# Patient Record
Sex: Female | Born: 1960 | Race: Black or African American | Hispanic: No | Marital: Single | Smoking: Never smoker
Health system: Southern US, Community
[De-identification: ages and names within clinical notes are randomized; demographics above are authoritative.]

## PROBLEM LIST (undated history)

## (undated) DIAGNOSIS — E785 Hyperlipidemia, unspecified: Secondary | ICD-10-CM

## (undated) DIAGNOSIS — E559 Vitamin D deficiency, unspecified: Secondary | ICD-10-CM

## (undated) DIAGNOSIS — Z87442 Personal history of urinary calculi: Secondary | ICD-10-CM

## (undated) DIAGNOSIS — H547 Unspecified visual loss: Secondary | ICD-10-CM

## (undated) DIAGNOSIS — I10 Essential (primary) hypertension: Secondary | ICD-10-CM

## (undated) DIAGNOSIS — L659 Nonscarring hair loss, unspecified: Secondary | ICD-10-CM

## (undated) DIAGNOSIS — F419 Anxiety disorder, unspecified: Secondary | ICD-10-CM

## (undated) DIAGNOSIS — E119 Type 2 diabetes mellitus without complications: Secondary | ICD-10-CM

## (undated) HISTORY — DX: Anxiety disorder, unspecified: F41.9

## (undated) HISTORY — DX: Nonscarring hair loss, unspecified: L65.9

## (undated) HISTORY — DX: Type 2 diabetes mellitus without complications: E11.9

## (undated) HISTORY — DX: Hyperlipidemia, unspecified: E78.5

## (undated) HISTORY — DX: Vitamin D deficiency, unspecified: E55.9

## (undated) HISTORY — DX: Personal history of urinary calculi: Z87.442

## (undated) HISTORY — DX: Essential (primary) hypertension: I10

## (undated) HISTORY — DX: Unspecified visual loss: H54.7

---

## 1984-05-12 HISTORY — PX: ABDOMINAL HYSTERECTOMY: SHX81

## 2001-03-13 ENCOUNTER — Emergency Department (HOSPITAL_COMMUNITY): Admission: EM | Admit: 2001-03-13 | Discharge: 2001-03-13 | Payer: Self-pay | Admitting: Emergency Medicine

## 2004-03-04 ENCOUNTER — Ambulatory Visit (HOSPITAL_COMMUNITY): Admission: RE | Admit: 2004-03-04 | Discharge: 2004-03-04 | Payer: Self-pay | Admitting: Internal Medicine

## 2005-12-19 IMAGING — CR DG CHEST 2V
2 series · 2 of 2 positions shown · non-contrast
Comparison: none

CLINICAL DATA: Cough, congestion, right-sided back pain.  
 CHEST (TWO VIEWS)
 The heart size and mediastinal contours are normal. The lungs are clear. The visualized skeleton is unremarkable.

 IMPRESSION
 No active disease.

[view not recorded (1 of 2)]
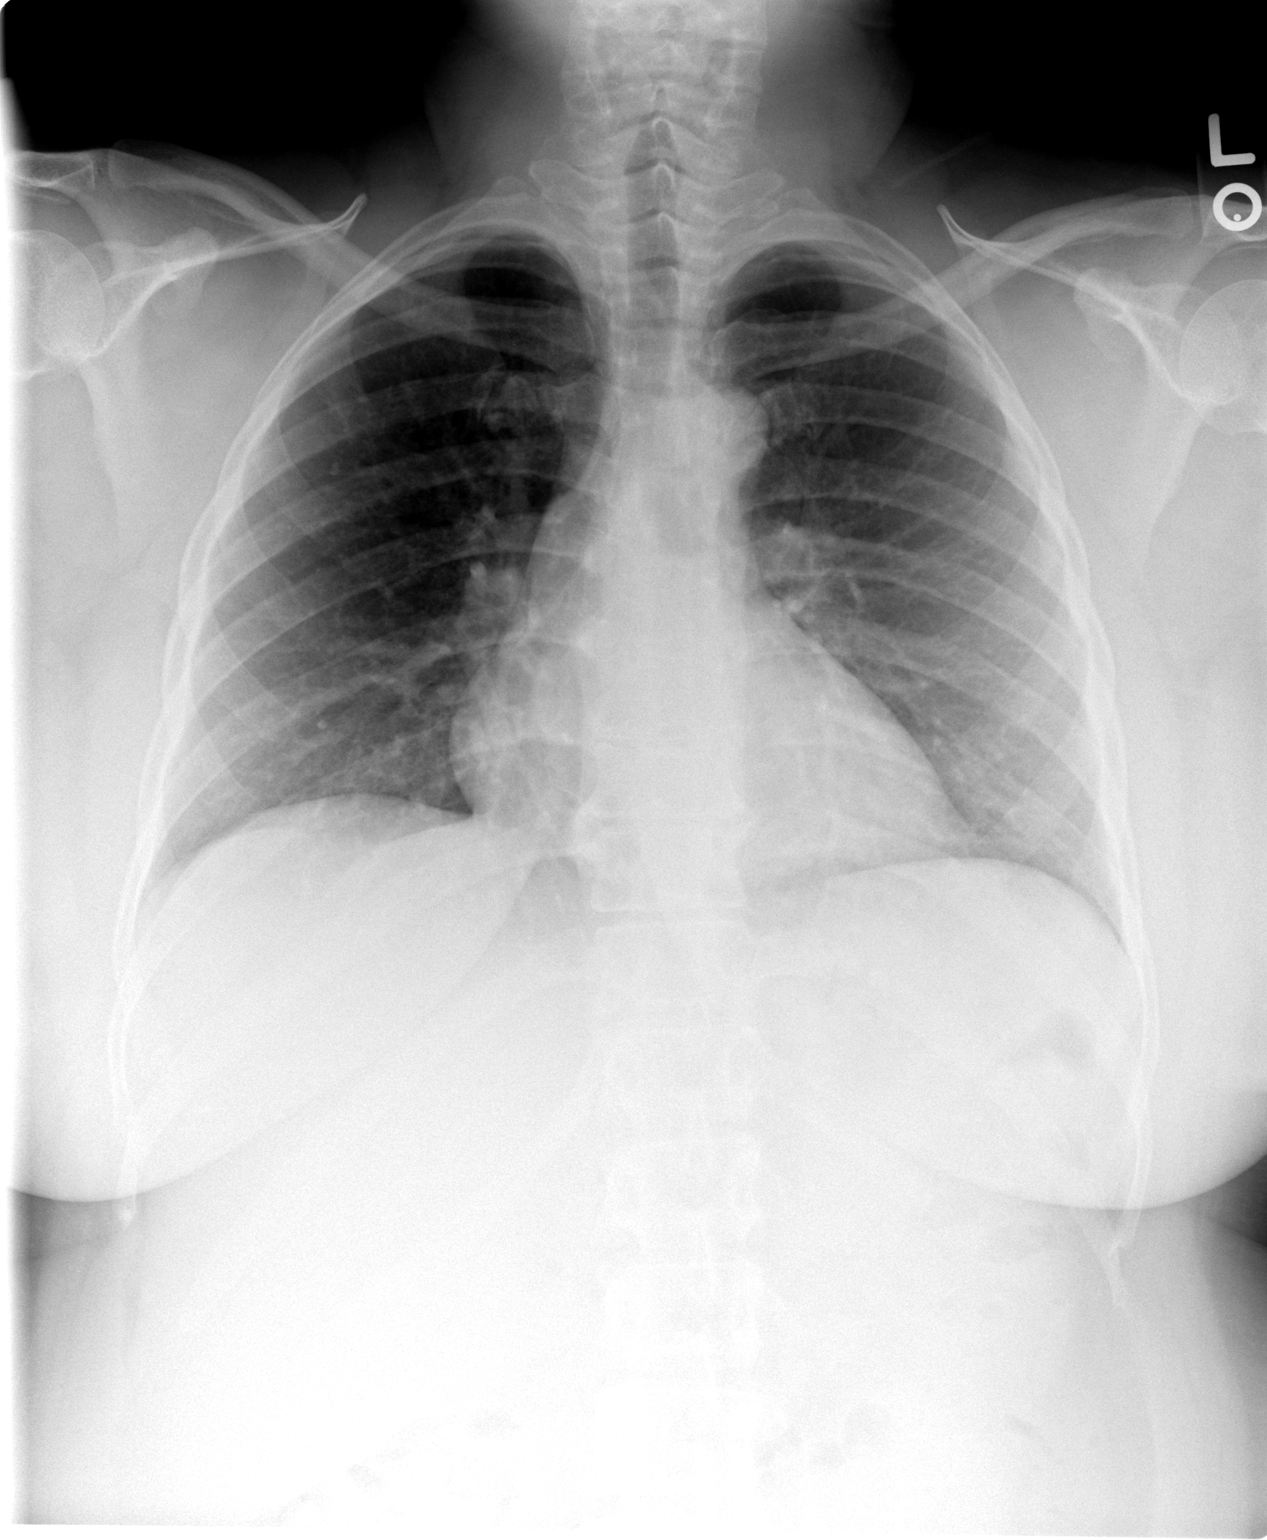

[view not recorded (2 of 2)]
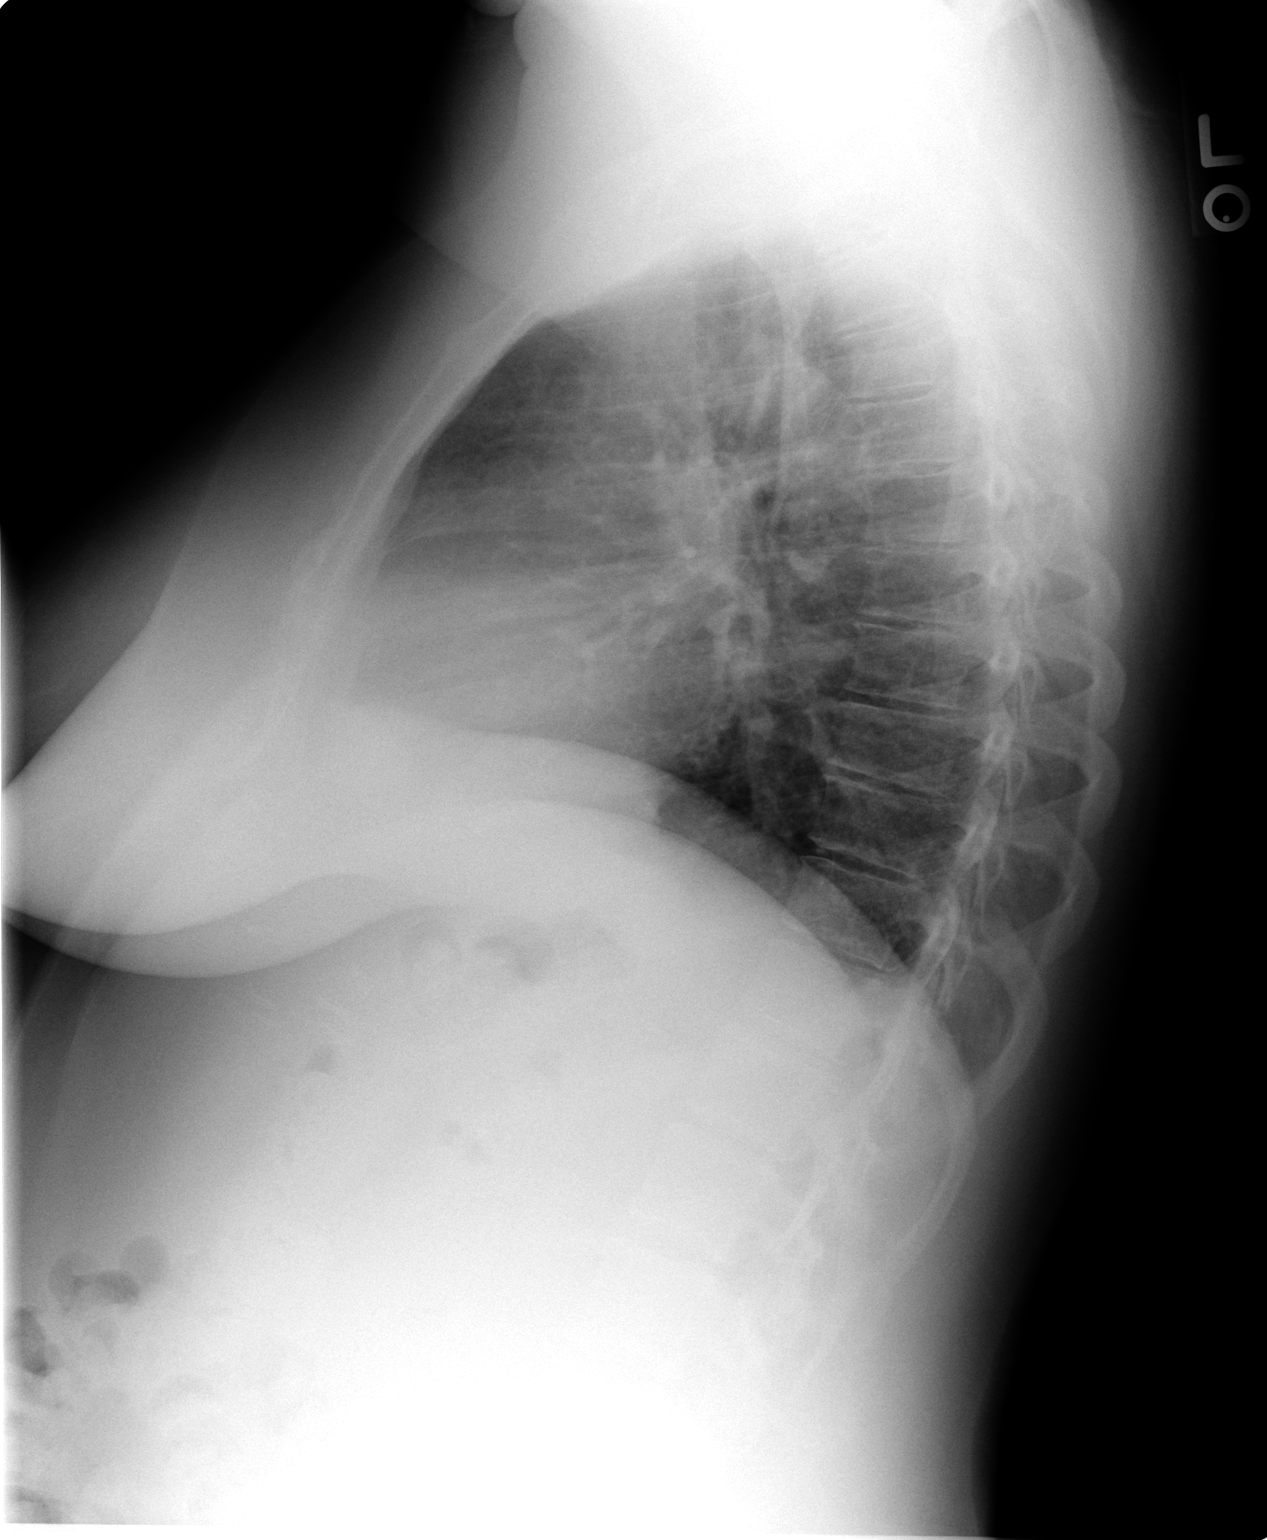

[2 of 2 positions shown; findings below may reference images not displayed]

## 2008-05-25 ENCOUNTER — Encounter: Admission: RE | Admit: 2008-05-25 | Discharge: 2008-05-25 | Payer: Self-pay | Admitting: Obstetrics and Gynecology

## 2010-06-20 ENCOUNTER — Other Ambulatory Visit: Payer: Self-pay | Admitting: Family Medicine

## 2010-06-20 ENCOUNTER — Ambulatory Visit
Admission: RE | Admit: 2010-06-20 | Discharge: 2010-06-20 | Disposition: A | Discharge status: Home / Self Care | Payer: PRIVATE HEALTH INSURANCE | Source: Ambulatory Visit | Attending: Family Medicine | Admitting: Family Medicine

## 2010-06-20 DIAGNOSIS — M839 Adult osteomalacia, unspecified: Secondary | ICD-10-CM

## 2010-06-20 LAB — HM DEXA SCAN: HM Dexa Scan: NORMAL

## 2010-06-24 ENCOUNTER — Other Ambulatory Visit: Payer: Self-pay

## 2010-09-12 ENCOUNTER — Other Ambulatory Visit: Payer: Self-pay | Admitting: Family Medicine

## 2010-09-12 DIAGNOSIS — M545 Low back pain: Secondary | ICD-10-CM

## 2010-09-14 ENCOUNTER — Other Ambulatory Visit: Payer: PRIVATE HEALTH INSURANCE

## 2010-09-24 ENCOUNTER — Other Ambulatory Visit: Payer: PRIVATE HEALTH INSURANCE

## 2012-05-27 ENCOUNTER — Other Ambulatory Visit: Payer: Self-pay | Admitting: Specialist

## 2012-05-27 DIAGNOSIS — M7130 Other bursal cyst, unspecified site: Secondary | ICD-10-CM

## 2012-05-31 ENCOUNTER — Ambulatory Visit
Admission: RE | Admit: 2012-05-31 | Discharge: 2012-05-31 | Disposition: A | Discharge status: Home / Self Care | Payer: Self-pay | Source: Ambulatory Visit | Attending: Specialist | Admitting: Specialist

## 2012-05-31 DIAGNOSIS — M7130 Other bursal cyst, unspecified site: Secondary | ICD-10-CM

## 2012-08-09 ENCOUNTER — Other Ambulatory Visit: Payer: Self-pay | Admitting: Family Medicine

## 2012-08-09 ENCOUNTER — Telehealth: Payer: Self-pay | Admitting: Family Medicine

## 2012-08-09 NOTE — Telephone Encounter (Signed)
Ok to refill 

## 2012-08-09 NOTE — Telephone Encounter (Signed)
Med c/o to pharmacy

## 2012-08-09 NOTE — Telephone Encounter (Signed)
Med c/o to pharmancy

## 2012-08-23 ENCOUNTER — Other Ambulatory Visit: Payer: Self-pay | Admitting: Family Medicine

## 2012-10-01 ENCOUNTER — Telehealth: Payer: Self-pay | Admitting: Family Medicine

## 2012-10-01 MED ORDER — ALPRAZOLAM 1 MG PO TABS: 1.0000 mg | ORAL_TABLET | Freq: Every evening | ORAL | Status: DC | PRN

## 2012-10-01 NOTE — Telephone Encounter (Signed)
Ok to refill 

## 2012-10-01 NOTE — Telephone Encounter (Signed)
?   OK to Refill  

## 2012-11-18 ENCOUNTER — Other Ambulatory Visit: Payer: Self-pay | Admitting: Family Medicine

## 2012-12-16 ENCOUNTER — Other Ambulatory Visit: Payer: Self-pay | Admitting: Family Medicine

## 2013-01-11 ENCOUNTER — Other Ambulatory Visit: Payer: Self-pay | Admitting: Family Medicine

## 2013-01-11 NOTE — Telephone Encounter (Signed)
?   OK to Refill  

## 2013-01-11 NOTE — Telephone Encounter (Signed)
ok 

## 2013-01-21 ENCOUNTER — Encounter: Payer: Self-pay | Admitting: Family Medicine

## 2013-01-21 ENCOUNTER — Other Ambulatory Visit: Payer: Self-pay | Admitting: Family Medicine

## 2013-01-21 DIAGNOSIS — I10 Essential (primary) hypertension: Secondary | ICD-10-CM | POA: Insufficient documentation

## 2013-01-21 DIAGNOSIS — F419 Anxiety disorder, unspecified: Secondary | ICD-10-CM | POA: Insufficient documentation

## 2013-01-21 DIAGNOSIS — E785 Hyperlipidemia, unspecified: Secondary | ICD-10-CM | POA: Insufficient documentation

## 2013-01-21 DIAGNOSIS — H547 Unspecified visual loss: Secondary | ICD-10-CM | POA: Insufficient documentation

## 2013-01-21 DIAGNOSIS — E119 Type 2 diabetes mellitus without complications: Secondary | ICD-10-CM | POA: Insufficient documentation

## 2013-01-21 DIAGNOSIS — E559 Vitamin D deficiency, unspecified: Secondary | ICD-10-CM | POA: Insufficient documentation

## 2013-01-21 DIAGNOSIS — Z87442 Personal history of urinary calculi: Secondary | ICD-10-CM | POA: Insufficient documentation

## 2013-01-21 NOTE — Telephone Encounter (Signed)
Medication refill for one time only.  Patient needs to be seen.  Letter sent for patient to call and schedule Patient last seen 01/2012  Was told to return in 3 months.

## 2013-01-31 ENCOUNTER — Encounter: Payer: Self-pay | Admitting: Physical Medicine & Rehabilitation

## 2013-02-16 ENCOUNTER — Other Ambulatory Visit: Payer: Self-pay | Admitting: Family Medicine

## 2013-02-17 NOTE — Telephone Encounter (Signed)
?   OK to Refill  

## 2013-02-17 NOTE — Telephone Encounter (Signed)
ok 

## 2013-02-17 NOTE — Telephone Encounter (Signed)
Meds refilled.

## 2013-02-18 ENCOUNTER — Encounter: Payer: Self-pay | Admitting: Physical Medicine & Rehabilitation

## 2013-02-18 ENCOUNTER — Encounter: Payer: Worker's Compensation | Attending: Physical Medicine & Rehabilitation

## 2013-02-18 ENCOUNTER — Ambulatory Visit (HOSPITAL_BASED_OUTPATIENT_CLINIC_OR_DEPARTMENT_OTHER): Payer: Worker's Compensation | Admitting: Physical Medicine & Rehabilitation

## 2013-02-18 VITALS — BP 178/101 | HR 84 | Resp 14 | Ht 61.0 in | Wt 171.4 lb

## 2013-02-18 DIAGNOSIS — M5030 Other cervical disc degeneration, unspecified cervical region: Secondary | ICD-10-CM

## 2013-02-18 DIAGNOSIS — M545 Low back pain, unspecified: Secondary | ICD-10-CM

## 2013-02-18 DIAGNOSIS — M47812 Spondylosis without myelopathy or radiculopathy, cervical region: Secondary | ICD-10-CM | POA: Insufficient documentation

## 2013-02-18 DIAGNOSIS — M5380 Other specified dorsopathies, site unspecified: Secondary | ICD-10-CM

## 2013-02-18 NOTE — Progress Notes (Signed)
Subjective:    Patient ID: Rebecca Mora, female    DOB: [birth year], 52 y.o.   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 Pain was not immediate but started several hours later. Some pain into right arm.  Also has pain that radiates from neck into posterior scalp  Low back pain as well radiating to RLE Initially seen by regional physicians, x-rays taken, prescribed Vicodin, sent to physical therapy. Saw PT for 24 visits.  Heat, biofreeze and TENS unit.  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 Supra and infraspinatus tendinosis. Moderate a.c. joint arthritis MRI lumbar spine 03/18/2012 normal MRI of the cervical spine 06/07/2012. Right C6-C7 disc protrusion without compression of the right C7 nerve root.  Underwent attempted cyst drainage by interventional radiology- pt states her arm "has never been the same since" Underwent right C4-5 and right C6-C7 transforaminal epidural steroid injection under fluoroscopic guidance 07/20/2012. Far lateral foramen injection secondary to vascular structures. Was reported as helpful 08/03/2012 right third occipital nerve blocks under fluoroscopic guidance. 08/24/2012 bilateral facet joints injections L4-5, L5-S1 10/27/2012 bilateral L3-L4 lumbar medial branch and L5 dorsal ramus injections without relief Followup after injections. Had improved tingling right hand. Residual right shoulder pain. Had a right shoulder injection with Dr.Collins, orthopedics, feels like her headache and neck pain started back up  Patient trials on tramadol which helps somewhat, currently on baclofen and neurontin with some benefit. PMH: HTN (pt states it is due to pain from injury, but has been treated with antihypertensives in past), DM Pain Inventory Average Pain 10 Pain Right Now  10 My pain is constant, sharp and aching  In the last 24 hours, has pain interfered with the following? General activity 10 Relation with others 9 Enjoyment of life 10 What TIME of day is your pain at its worst? morning and night Sleep (in general) Poor  Pain is worse with: walking, sitting, standing and some activites Pain improves with: rest, heat/ice, medication and TENS Relief from Meds: 8  Mobility walk without assistance ability to climb steps?  yes do you drive?  yes  Function disabled: date disabled 12/09/11 I need assistance with the following:  dressing and household duties  Neuro/Psych weakness numbness tingling trouble walking spasms depression anxiety suicidal thoughts  Just thoughts but no plan  Prior Studies Any changes since last visit?  no  Physicians involved in your care Any changes since last visit?  no   History reviewed. No pertinent family history. History   Social History  . Marital Status: Single    Spouse Name: N/A    Number of Children: N/A  . Years of Education: N/A   Social History Main Topics  . Smoking status: Never Smoker   . Smokeless tobacco: Never Used  . Alcohol Use: No  . Drug Use: No  . Sexual Activity: None   Other Topics Concern  . None   Social History Narrative  . None   Past Surgical History  Procedure Laterality Date  . Abdominal hysterectomy  1986   Past Medical History  Diagnosis Date  . Diabetes mellitus without complication   . Hypertension   . Vision decreased   . Hyperlipidemia   . Hair loss   . Anxiety   . History of renal calculi   . Unspecified vitamin D deficiency  BP 178/101  Pulse 84  Resp 14  Ht 5\' 1"  (1.549 m)  Wt 171 lb 6.4 oz (77.747 kg)  BMI 32.4 kg/m2  SpO2 99%   Review of Systems  Constitutional: Positive for diaphoresis.  Gastrointestinal: Positive for nausea.  Endocrine:       High blood sugar  Musculoskeletal: Positive for back pain and gait problem.        Spasms  Neurological: Positive for weakness and numbness.       Tingling  Psychiatric/Behavioral: Positive for suicidal ideas and dysphoric mood. The patient is nervous/anxious.        No plan  All other systems reviewed and are negative.       Objective:   Physical Exam  Nursing note and vitals reviewed. Constitutional: She is oriented to person, place, and time. She appears well-developed and well-nourished.  HENT:  Head: Normocephalic and atraumatic.  Eyes: Conjunctivae and EOM are normal. Pupils are equal, round, and reactive to light.  Neck: Normal range of motion.  Neurological: She is alert and oriented to person, place, and time. She has normal strength and normal reflexes. Coordination and gait normal.  Reduced sensory  right lower extremity non-dermatomal patterns Reduced in the right upper in a nondermatomal pattern  Psychiatric: Her mood appears anxious. Her affect is labile.   Right Shoulder was negative impingement signs. Pain over the right a.c. joint and pain with adduction right shoulder Left shoulder normal exam Pain with minimal palpation of the trapezius muscles thoracic and lumbar paraspinal muscles.  No pain with hip and knee or ankle range of motion. No pain with elbow or hand range of motion        Assessment & Plan:   Work related injury with multiple pain complaints. 1. Low back pain with 1set of the facet joints injections at the L4-L5 and L5-S1 levels that were helpful but were intra-articular Second set medial branch blocks L3, L4 as well as L5 dorsal ramus. This also blocks pain from the L4-L5 and L5-S1 levels. This is more predictable of response to radio frequency neuropathy which targets the same nerves.  Therefore I do not think radio frequency ablation in the lumbar area would be helpful for this patient. In addition she is positive Waddell's signs , specifically pain with very superficial palpation in the thoracic and lumbar paraspinal  area.  For this reason as well as reported anxiety and depression, I would recommend pain psychology evaluation.  2. In regards to the third occipital nerve block for neck and headache pain. This is not for occipital neuralgia but rather to address C2-C3 cervical facet joint. This is essentially a nomenclature issue.  Right C2-C3 facet joint mediated pain can cause both upper neck pain as well as posterior headache pain. It is in part supplied by the third occipital nerve. Patient appears to have had good relief with third occipital nerve block. I believe it wore off at about the same time that she received a shoulder injection from orthopedics. I do not think the shoulder injections caused the headaches to return. I would recommend a repeat third occipital nerve block and if she once again has a good but temporary relief, then I would recommend third occipital nerve radio frequency ablation  3. Right shoulder pain I do not think the supraspinatus cyst is causing this pain. Her exam is most consistent with a.c. joint arthropathy and she may benefit from an ultrasound guided injection. This can be either a diagnostic injection with  2% lidocaine only or diagnostic plus therapeutic using both lidocaine and a corticosteroid  4. Pain medication management. I would not institute narcotic analgesics in this situation. Her current medications seem to be helping to some degree. No other recommendations.  This is a consultation only. No treatment was rendered.  Discussed my findings with Case manager Darcus Austin fax [phone] and patient                    Erick Colace M.D. [facility] FAAPM&R (Sports Med, Neuromuscular Med) Diplomate Am Board of Electrodiagnostic Med Diplomate Am Board of Pain Medicine Fellow Am Board of Interventional Pain Physicians

## 2013-02-18 NOTE — Patient Instructions (Signed)
This was a consultation only  I do not recommend radio frequency to the lumbar area, based on one set of medial branch blocks being helpful and the other not helpful.  I recommend another right third occipital nerve block and if this has temporary benefits then proceed to radio frequency ablation  I believe the right shoulder pain is in large part due to a.c. joint arthritis. Ultrasound guided a.c. joint injection may be helpful in this regard.  I do not have any additional pain medication recommendations. Medications she is on now appear appropriate.

## 2013-02-25 ENCOUNTER — Encounter: Payer: Self-pay | Admitting: Family Medicine

## 2013-02-25 ENCOUNTER — Other Ambulatory Visit: Payer: Self-pay | Admitting: Family Medicine

## 2013-02-25 NOTE — Telephone Encounter (Signed)
Medication refill for one time only.  Patient needs to be seen.  Letter sent for patient to call and schedule 

## 2013-03-28 ENCOUNTER — Other Ambulatory Visit: Payer: Self-pay | Admitting: Family Medicine

## 2013-04-11 ENCOUNTER — Encounter: Payer: Self-pay | Admitting: Family Medicine

## 2013-04-11 ENCOUNTER — Ambulatory Visit (INDEPENDENT_AMBULATORY_CARE_PROVIDER_SITE_OTHER): Payer: BC Managed Care – PPO | Admitting: Family Medicine

## 2013-04-11 VITALS — BP 130/80 | HR 82 | Temp 99.0°F | Resp 16 | Wt 174.0 lb

## 2013-04-11 DIAGNOSIS — F329 Major depressive disorder, single episode, unspecified: Secondary | ICD-10-CM

## 2013-04-11 LAB — LIPID PANEL
Cholesterol: 265 mg/dL — ABNORMAL HIGH (ref 0–200)
HDL: 55 mg/dL (ref >39)
LDL Cholesterol: 192 mg/dL — ABNORMAL HIGH (ref 0–99)
Total CHOL/HDL Ratio: 4.8 Ratio
Triglycerides: 88 mg/dL (ref <150)
VLDL: 18 mg/dL (ref 0–40)

## 2013-04-11 LAB — HEMOGLOBIN A1C
Hgb A1c MFr Bld: 8.6 % — ABNORMAL HIGH (ref <5.7)
Mean Plasma Glucose: 200 mg/dL — ABNORMAL HIGH (ref <117)

## 2013-04-11 LAB — COMPLETE METABOLIC PANEL WITH GFR
ALT: 28 U/L (ref 0–35)
AST: 25 U/L (ref 0–37)
Albumin: 4.4 g/dL (ref 3.5–5.2)
Alkaline Phosphatase: 113 U/L (ref 39–117)
BUN: 15 mg/dL (ref 6–23)
CO2: 28 mEq/L (ref 19–32)
Calcium: 9.5 mg/dL (ref 8.4–10.5)
Chloride: 103 mEq/L (ref 96–112)
Creat: 0.57 mg/dL (ref 0.50–1.10)
GFR, Est African American: >89 mL/min
GFR, Est Non African American: >89 mL/min
Glucose, Bld: 191 mg/dL — ABNORMAL HIGH (ref 70–99)
Potassium: 4.2 mEq/L (ref 3.5–5.3)
Sodium: 140 mEq/L (ref 135–145)
Total Bilirubin: 0.3 mg/dL (ref 0.3–1.2)
Total Protein: 7.1 g/dL (ref 6.0–8.3)

## 2013-04-11 MED ORDER — ALPRAZOLAM 1 MG PO TABS: ORAL_TABLET | ORAL | Status: DC

## 2013-04-11 MED ORDER — DULOXETINE HCL 30 MG PO CPEP: 60.0000 mg | ORAL_CAPSULE | Freq: Every day | ORAL | Status: DC

## 2013-04-11 NOTE — Progress Notes (Signed)
Subjective:    Patient ID: Rebecca Mora, female    DOB: 03-28-1961, 52 y.o.   MRN: [id number]  HPI I have not seen the patient in a year. Since her last office visit she was injured on the job. She suffered a herniated disc in her cervical spine which caused her tremendous neck pain and cervical radiculopathy into the right upper extremity. This is caused her to miss work for now over a year. She is currently in the legal process with compensation. This caused significant depression. She is unable to sleep due to the pain. She has constant neuropathic pain in her right arm. She is failed numerous epidural steroid injections. She is to gabapentin. She denies any suicidal ideation. She is having the Xanax more frequently due to the depression.  She is also here today to recheck her diabetes. She is not checking her sugars at all. Her blood pressures well controlled today on her medications. She denies any polyuria polydipsia or blurred vision. She denies any hypoglycemia. Past Medical History  Diagnosis Date  . Diabetes mellitus without complication   . Hypertension   . Vision decreased   . Hyperlipidemia   . Hair loss   . Anxiety   . History of renal calculi   . Unspecified vitamin D deficiency    Current Outpatient Prescriptions on File Prior to Visit  Medication Sig Dispense Refill  . baclofen (LIORESAL) 10 MG tablet Take 1 tablet by mouth 3 (three) times daily.      . dorzolamide (TRUSOPT) 2 % ophthalmic solution 1 drop 3 (three) times daily.      Marland Kitchen glipiZIDE (GLUCOTROL) 10 MG tablet TAKE 1 TABLET BY MOUTH TWICE DAILY  60 tablet  0  . imipramine (TOFRANIL) 50 MG tablet Take 50 mg by mouth at bedtime.      Marland Kitchen latanoprost (XALATAN) 0.005 % ophthalmic solution 1 drop at bedtime.      . metFORMIN (GLUCOPHAGE) 1000 MG tablet TAKE 1 TABLET BY MOUTH TWICE DAILY  60 tablet  0   No current facility-administered medications on file prior to visit.   Allergies  Allergen Reactions  . Lisinopril  Cough  . Simvastatin     Breast pain   History   Social History  . Marital Status: Single    Spouse Name: N/A    Number of Children: N/A  . Years of Education: N/A   Occupational History  . Not on file.   Social History Main Topics  . Smoking status: Never Smoker   . Smokeless tobacco: Never Used  . Alcohol Use: No  . Drug Use: No  . Sexual Activity: Not on file   Other Topics Concern  . Not on file   Social History Narrative  . No narrative on file      Review of Systems  All other systems reviewed and are negative.       Objective:   Physical Exam  Vitals reviewed. Cardiovascular: Normal rate and regular rhythm.   Pulmonary/Chest: Effort normal and breath sounds normal.  Psychiatric: She has a normal mood and affect. Her behavior is normal. Judgment and thought content normal.          Assessment & Plan:  1. Depression Majority of the office visit was spent in discussion regarding her depression. Begin Cymbalta 60 mg by mouth daily and recheck in one month. I did give the patient one time prescription for Xanax 0.5 mg tablets 1 by mouth every 8 hours as needed  for anxiety. I gave her 60 tablets 0 refills. I hope this Cymbalta take effect we can wean her back on the Xanax - DULoxetine (CYMBALTA) 30 MG capsule; Take 2 capsules (60 mg total) by mouth daily.  Dispense: 60 capsule; Refill: 3  2. Type II or unspecified type diabetes mellitus without mention of complication, uncontrolled Check CMP, fasting lipid panel, hemoglobin A1c. Consider invokana if greater than 7. - COMPLETE METABOLIC PANEL WITH GFR - Lipid panel - Hemoglobin A1c

## 2013-04-18 ENCOUNTER — Other Ambulatory Visit: Payer: Self-pay | Admitting: Family Medicine

## 2013-04-18 MED ORDER — ATORVASTATIN CALCIUM 40 MG PO TABS: 40.0000 mg | ORAL_TABLET | Freq: Every day | ORAL | Status: AC

## 2013-04-18 MED ORDER — SITAGLIPTIN PHOSPHATE 100 MG PO TABS: 100.0000 mg | ORAL_TABLET | Freq: Every day | ORAL | Status: DC

## 2013-04-18 NOTE — Telephone Encounter (Signed)
Med sent to pharm 

## 2013-04-23 ENCOUNTER — Other Ambulatory Visit: Payer: Self-pay | Admitting: Family Medicine

## 2013-05-13 ENCOUNTER — Ambulatory Visit: Payer: Self-pay | Admitting: Family Medicine

## 2013-05-30 ENCOUNTER — Other Ambulatory Visit: Payer: Self-pay | Admitting: Family Medicine

## 2013-05-30 NOTE — Telephone Encounter (Signed)
Medication refilled per protocol. 

## 2013-06-24 ENCOUNTER — Telehealth: Payer: Self-pay | Admitting: Family Medicine

## 2013-06-24 NOTE — Telephone Encounter (Signed)
Call back number [phone] Pharmacy is [facility] [place] Pt is needing a refill strips for her machine

## 2013-06-24 NOTE — Telephone Encounter (Signed)
TTC pt back - line busy - Need to know what kind of meter she has and how many times she checks her BS???

## 2013-06-30 MED ORDER — GLUCOSE BLOOD VI STRP: ORAL_STRIP | Status: AC

## 2013-06-30 NOTE — Telephone Encounter (Signed)
Refilled strips for accu-chek contour

## 2013-07-26 ENCOUNTER — Other Ambulatory Visit: Payer: Self-pay | Admitting: Family Medicine

## 2013-07-27 NOTE — Telephone Encounter (Signed)
?   OK to Refill  

## 2013-07-28 NOTE — Telephone Encounter (Signed)
ok 

## 2013-07-28 NOTE — Telephone Encounter (Signed)
Medication called to pharmacy. 

## 2013-08-11 ENCOUNTER — Ambulatory Visit (INDEPENDENT_AMBULATORY_CARE_PROVIDER_SITE_OTHER): Payer: BC Managed Care – PPO | Admitting: Family Medicine

## 2013-08-11 ENCOUNTER — Ambulatory Visit: Payer: BC Managed Care – PPO | Admitting: Family Medicine

## 2013-08-11 ENCOUNTER — Telehealth: Payer: Self-pay | Admitting: *Deleted

## 2013-08-11 ENCOUNTER — Encounter: Payer: Self-pay | Admitting: Family Medicine

## 2013-08-11 VITALS — BP 130/90 | HR 74 | Temp 98.5°F | Resp 18 | Ht 61.0 in | Wt 172.0 lb

## 2013-08-11 DIAGNOSIS — M5416 Radiculopathy, lumbar region: Secondary | ICD-10-CM

## 2013-08-11 DIAGNOSIS — IMO0002 Reserved for concepts with insufficient information to code with codable children: Secondary | ICD-10-CM

## 2013-08-11 DIAGNOSIS — M5412 Radiculopathy, cervical region: Secondary | ICD-10-CM

## 2013-08-11 DIAGNOSIS — E118 Type 2 diabetes mellitus with unspecified complications: Principal | ICD-10-CM

## 2013-08-11 DIAGNOSIS — E1165 Type 2 diabetes mellitus with hyperglycemia: Secondary | ICD-10-CM

## 2013-08-11 DIAGNOSIS — M5010 Cervical disc disorder with radiculopathy, unspecified cervical region: Secondary | ICD-10-CM

## 2013-08-11 LAB — COMPLETE METABOLIC PANEL WITH GFR
ALBUMIN: 4.2 g/dL (ref 3.5–5.2)
ALT: 28 U/L (ref 0–35)
AST: 25 U/L (ref 0–37)
Alkaline Phosphatase: 98 U/L (ref 39–117)
BUN: 15 mg/dL (ref 6–23)
CALCIUM: 9.2 mg/dL (ref 8.4–10.5)
CHLORIDE: 106 meq/L (ref 96–112)
CO2: 26 meq/L (ref 19–32)
Creat: 0.61 mg/dL (ref 0.50–1.10)
GFR, Est African American: >89 mL/min
GFR, Est Non African American: >89 mL/min
Glucose, Bld: 78 mg/dL (ref 70–99)
Potassium: 4.1 mEq/L (ref 3.5–5.3)
Sodium: 141 mEq/L (ref 135–145)
Total Bilirubin: 0.2 mg/dL (ref 0.2–1.2)
Total Protein: 6.9 g/dL (ref 6.0–8.3)

## 2013-08-11 LAB — HEMOGLOBIN A1C
HEMOGLOBIN A1C: 7.2 % — AB (ref <5.7)
MEAN PLASMA GLUCOSE: 160 mg/dL — AB (ref <117)

## 2013-08-11 MED ORDER — HYDROCODONE-ACETAMINOPHEN 5-325 MG PO TABS: 1.0000 | ORAL_TABLET | Freq: Four times a day (QID) | ORAL | Status: DC | PRN

## 2013-08-11 NOTE — Addendum Note (Signed)
Addended by: Legrand RamsWILLIS, Jessamyn Watterson B on: 08/11/2013 04:57 PM   Modules accepted: Orders

## 2013-08-11 NOTE — Telephone Encounter (Signed)
RX printed, left up front and patient aware to pick up  

## 2013-08-11 NOTE — Telephone Encounter (Signed)
Brandi from Dr. Lacinda AxonBetheas office called stating that it was ok if Dr. Tanya NonesPickard wrote out her Pain medication hydrocodone.

## 2013-08-11 NOTE — Progress Notes (Signed)
Subjective:    Patient ID: Rebecca Mora, female    DOB: 1960/05/24, 53 y.o.   MRN: [id number]  HPI  Patient is very upset today. She states she's and uncontrolled pain. There is a very complicated past medical history. I have no old records to review. Apparently she hurt her neck on the job. She's been seeing workers, doctors over the last year. She is diagnosed with cervical degenerative disc disease and bulging disc causing pain in her neck radiating into her right arm. She underwent 2 epidural steroid injections in her neck. She continues to have severe unrelenting pain in her right arm along with numbness and tingling in right arm. She is also seeing orthopedist associated with workers compensation he thought the pain in her arm may be due to her rotator cuff. There has been apparently debate and argument over which is the source of her problem. However the problem continues to worsen. She has no conduction studies/EMG studies pending to her pain management specialist. I have no records to review this at this time. She also complains of pain in her lower back with numbness and tingling radiating into both legs. Apparently she had an MRI of the lumbar spine which showed degenerative disc disease at L4-L5. She states the doctor recommended possibly a fusion at this level. She complains of low back pain and pain in both hips and thighs.  She is requesting 30 hydrocodone today for pain. She is requesting a referral to help her deal with the pain. She states that she is unable to work based on the restrictions that were compensation doctor gave her. Overall she is very distraught. Apparently she last compensation specialist March 25. Past Medical History  Diagnosis Date  . Diabetes mellitus without complication   . Hypertension   . Vision decreased   . Hyperlipidemia   . Hair loss   . Anxiety   . History of renal calculi   . Unspecified vitamin D deficiency    Current Outpatient Prescriptions on  File Prior to Visit  Medication Sig Dispense Refill  . ALPRAZolam (XANAX) 1 MG tablet TAKE 1 TABLET BY MOUTH AT BEDTIME AS NEEDED  60 tablet  0  . atorvastatin (LIPITOR) 40 MG tablet Take 1 tablet (40 mg total) by mouth daily.  30 tablet  3  . dorzolamide (TRUSOPT) 2 % ophthalmic solution 1 drop 3 (three) times daily.      Marland Kitchen glipiZIDE (GLUCOTROL) 10 MG tablet TAKE 1 TABLET BY MOUTH TWICE DAILY  60 tablet  5  . glucose blood test strip Check BS bid  100 each  11  . metFORMIN (GLUCOPHAGE) 1000 MG tablet TAKE 1 TABLET BY MOUTH TWICE DAILY  60 tablet  3  . sitaGLIPtin (JANUVIA) 100 MG tablet Take 1 tablet (100 mg total) by mouth daily.  30 tablet  3  . HYDROcodone-acetaminophen (NORCO/VICODIN) 5-325 MG per tablet Take 1 tablet by mouth every 6 (six) hours as needed for moderate pain.       No current facility-administered medications on file prior to visit.   Allergies  Allergen Reactions  . Lisinopril Cough  . Simvastatin     Breast pain   History   Social History  . Marital Status: Single    Spouse Name: N/A    Number of Children: N/A  . Years of Education: N/A   Occupational History  . Not on file.   Social History Main Topics  . Smoking status: Never Smoker   . Smokeless  tobacco: Never Used  . Alcohol Use: No  . Drug Use: No  . Sexual Activity: Not on file   Other Topics Concern  . Not on file   Social History Narrative  . No narrative on file     Review of Systems  All other systems reviewed and are negative.       Objective:   Physical Exam  Vitals reviewed. Cardiovascular: Normal rate and regular rhythm.   Pulmonary/Chest: Effort normal and breath sounds normal.   the entire office visit was spent in discussion of her history and her symptoms. Over 25 minutes was spent in discussion. It was not time for a physical examination.        Assessment & Plan:  1. Type II or unspecified type diabetes mellitus with unspecified complication,  uncontrolled Patient is overdue to hemoglobin A1c. I will get this today while she is here - Hemoglobin A1c - COMPLETE METABOLIC PANEL WITH GFR  2. Cervical disc disorder with radiculopathy of cervical region  3. Lumbar radiculopathy  Both of these issues are problems which have been occurring for over one year in the arena of workers compensation. She has seen two different orthopedists along with a pain management specialist. There has been apparently some debate over the source of her pain. I have no records to review. I do not have access to the MRI of her cervical spine or lumbar spine. I do not have the results of EMG nerve conduction studies. Based on history alone it sounds like the patient may have cervical radiculopathy and lumbar radiculopathy. Short of surgery I can offer the patient and a trial of lyrica to treat her pain. However I have no records what medications have been tried in the past. The patient states the gabapentin made her too sleepy. She tried a handful of other medications that did not help. She specifically wants hydrocodone today. I explained to the patient that because she sees a pain management doctor, she would need to call that doctor and get their permission before I would refill any hydrocodone. I would like to get the records so that I can review them and see all the test and medications that have been tried over the last year before I could make a guess as to her diagnosis or any methods of treatment. After I had a chance to review all of her records including her studies, I can maybe better help her control her pain.

## 2013-08-21 ENCOUNTER — Other Ambulatory Visit: Payer: Self-pay | Admitting: Family Medicine

## 2013-08-22 ENCOUNTER — Telehealth: Payer: Self-pay | Admitting: Family Medicine

## 2013-08-22 NOTE — Telephone Encounter (Signed)
PA for Januvia sent through [email or web address]

## 2013-08-24 ENCOUNTER — Telehealth: Payer: Self-pay | Admitting: *Deleted

## 2013-08-24 MED ORDER — SITAGLIPTIN PHOSPHATE 100 MG PO TABS: ORAL_TABLET | ORAL | Status: DC

## 2013-08-24 NOTE — Telephone Encounter (Signed)
Refill appropriate and filled per protocol. 

## 2013-08-24 NOTE — Telephone Encounter (Signed)
Message copied by Phillips OdorSIX, Dash Cardarelli H on Wed Aug 24, 2013  3:35 PM ------      Message from: Malvin JohnsBULLINS, SUSAN S      Created: Wed Aug 24, 2013 12:26 PM       Patient is calling for refill on her SitaGLIPtin Phosphate (Tab) needs to [facility] to [facility] phone number [phone] ------

## 2013-08-24 NOTE — Telephone Encounter (Signed)
Received PA determination for Januvia.   PA denied.   Patient must try and fail Onglyza.  Md to be made aware.

## 2013-08-25 NOTE — Telephone Encounter (Signed)
Please switch the patient to onglyza 5 mg poqday.

## 2013-08-26 MED ORDER — SAXAGLIPTIN HCL 5 MG PO TABS: 5.0000 mg | ORAL_TABLET | Freq: Every day | ORAL | Status: DC

## 2013-08-26 NOTE — Telephone Encounter (Signed)
Pt called and requested med to be sent to different pharm. Med sent to requested pharm.

## 2013-08-26 NOTE — Telephone Encounter (Signed)
Prescription sent to pharmacy.

## 2013-08-26 NOTE — Addendum Note (Signed)
Addended by: Legrand RamsWILLIS, SANDY B on: 08/26/2013 11:41 AM   Modules accepted: Orders

## 2013-08-29 ENCOUNTER — Telehealth: Payer: Self-pay | Admitting: Family Medicine

## 2013-08-29 NOTE — Telephone Encounter (Signed)
We can do this one time until she gets nerve conduction studies.  I have no received any records from her doctors.  I will not be able to refill this in the future without their records.

## 2013-08-29 NOTE — Telephone Encounter (Signed)
?   OK to Refill and increase dose

## 2013-08-29 NOTE — Telephone Encounter (Signed)
Patient would like refill on her hydrocodone but she needs 10/325 this time instead please call her at [phone] with questions

## 2013-08-30 MED ORDER — HYDROCODONE-ACETAMINOPHEN 10-325 MG PO TABS: 1.0000 | ORAL_TABLET | Freq: Three times a day (TID) | ORAL | Status: AC | PRN

## 2013-08-30 NOTE — Telephone Encounter (Signed)
Pt called told Rx ready for pick up.  Also told no more refills until Dr Tanya NonesPickard get the records from other providers.  She said she will sigh record release when she come for RX

## 2013-09-20 ENCOUNTER — Telehealth: Payer: Self-pay | Admitting: Family Medicine

## 2013-09-20 NOTE — Telephone Encounter (Signed)
Ok to refill??  Last office visit 08/11/2013.  Last refill 07/28/2013.

## 2013-09-20 NOTE — Telephone Encounter (Signed)
ok 

## 2013-09-20 NOTE — Telephone Encounter (Signed)
PATIENT CHANGED PHARMACIES AND NEED HER XANAX [facility] [place] HER PHONE NUMBER IS [phone]

## 2013-09-21 MED ORDER — ALPRAZOLAM 1 MG PO TABS: ORAL_TABLET | ORAL | Status: DC

## 2013-09-21 NOTE — Telephone Encounter (Signed)
Medication called to pharmacy. 

## 2013-09-27 ENCOUNTER — Telehealth: Payer: Self-pay | Admitting: Family Medicine

## 2013-09-27 NOTE — Telephone Encounter (Signed)
Patient is calling to get rx for her hydrocodone [phone]

## 2013-09-27 NOTE — Telephone Encounter (Signed)
?   OK to Refill  

## 2013-09-29 ENCOUNTER — Other Ambulatory Visit: Payer: Self-pay | Admitting: Family Medicine

## 2013-09-29 MED ORDER — METFORMIN HCL 1000 MG PO TABS: ORAL_TABLET | ORAL | Status: DC

## 2013-09-29 NOTE — Telephone Encounter (Signed)
Call placed to patient and patient made aware.   Patient states that she signed consent for release of records for Pain Management and neurology.   Medical records states that she believes that records have been obtained and sent to scan.   Patient continues to report increased pain.   MD please advise.

## 2013-09-29 NOTE — Telephone Encounter (Signed)
Denied.  I gave her a 1 time rx until she had her nerve conduction study.  .  I have never received any records from her pain management specialist. This needs to be addressed by her pain management dr or I need records.

## 2013-09-29 NOTE — Telephone Encounter (Signed)
Call placed to patient and patient made aware.  

## 2013-09-29 NOTE — Telephone Encounter (Signed)
1 doctor needs to be managing her pain for her safety.  Therefore, as long as she is seeing ortho and pain management, I will defer this to their management.  When they release her, I will assume care.

## 2013-10-28 ENCOUNTER — Other Ambulatory Visit: Payer: Self-pay | Admitting: Family Medicine

## 2013-10-28 NOTE — Telephone Encounter (Signed)
Ok to refill??  Last office visit 08/11/2013.  Last refill 09/21/2013.

## 2013-10-28 NOTE — Telephone Encounter (Signed)
Okay to refill? 

## 2013-12-06 ENCOUNTER — Other Ambulatory Visit: Payer: Self-pay | Admitting: Family Medicine

## 2013-12-06 NOTE — Telephone Encounter (Signed)
Refill appropriate and filled per protocol. 

## 2013-12-19 ENCOUNTER — Other Ambulatory Visit: Payer: Self-pay | Admitting: Family Medicine

## 2013-12-19 NOTE — Telephone Encounter (Signed)
ok 

## 2013-12-19 NOTE — Telephone Encounter (Signed)
Medication called to pharmacy. 

## 2013-12-19 NOTE — Telephone Encounter (Signed)
Ok to refill??  Last office visit 08/11/2013.  Last refill 10/28/2013.

## 2014-01-03 ENCOUNTER — Ambulatory Visit: Payer: BC Managed Care – PPO | Admitting: Family Medicine

## 2014-02-07 ENCOUNTER — Other Ambulatory Visit: Payer: Self-pay | Admitting: Family Medicine

## 2014-02-07 ENCOUNTER — Encounter: Payer: Self-pay | Admitting: Family Medicine

## 2014-02-07 NOTE — Telephone Encounter (Signed)
Medication refill for one time only.  Patient needs to be seen.  Letter sent for patient to call and schedule 

## 2014-02-24 ENCOUNTER — Telehealth: Payer: Self-pay | Admitting: Family Medicine

## 2014-02-24 NOTE — Telephone Encounter (Signed)
Requesting refill on Xanax 1mg  po QHS - last refill 12/19/13 - LOV 08/11/13  -  ? OK to Refill

## 2014-02-24 NOTE — Telephone Encounter (Signed)
ok 

## 2014-02-27 MED ORDER — ALPRAZOLAM 1 MG PO TABS: ORAL_TABLET | ORAL | Status: AC

## 2014-02-27 NOTE — Telephone Encounter (Signed)
Med called to pharm 

## 2014-03-17 IMAGING — US US GUIDANCE NEEDLE PLACEMENT
1 series · 13 of 14 positions shown · non-contrast
Comparison: Outside MRI

CLINICAL DATA: Shoulder pain after work injury.  MR demonstrates
cysts within the supraspinatus muscle.

IR ULTRASOUND GUIDED ASPIRATION/DRAINAGE

[Series 1: us guidance needle placement · 0.11mm/px · 14 acquisitions, 13 frames shown]
[im 1/14]
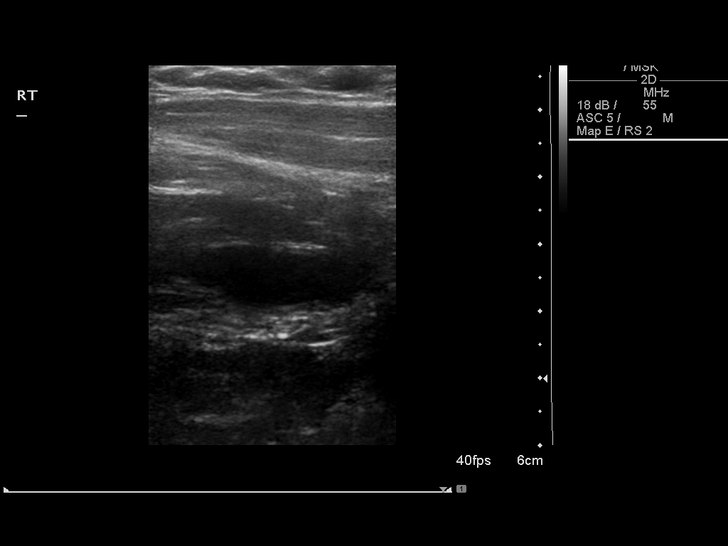
[im 2/14]
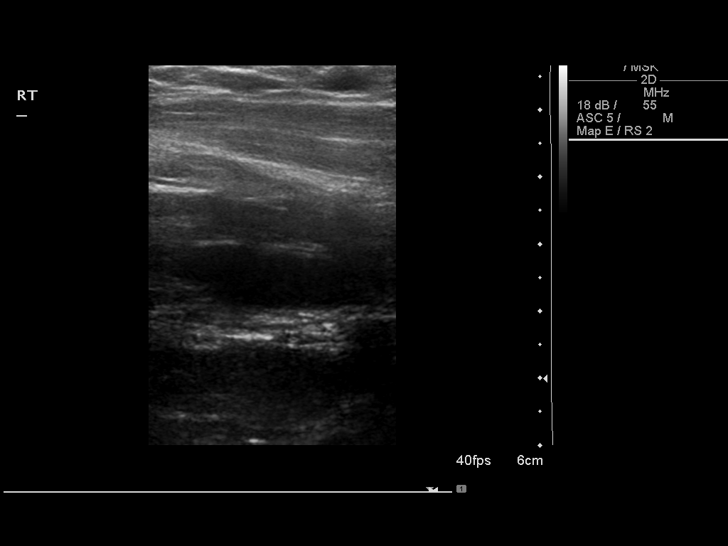
[im 3/14]
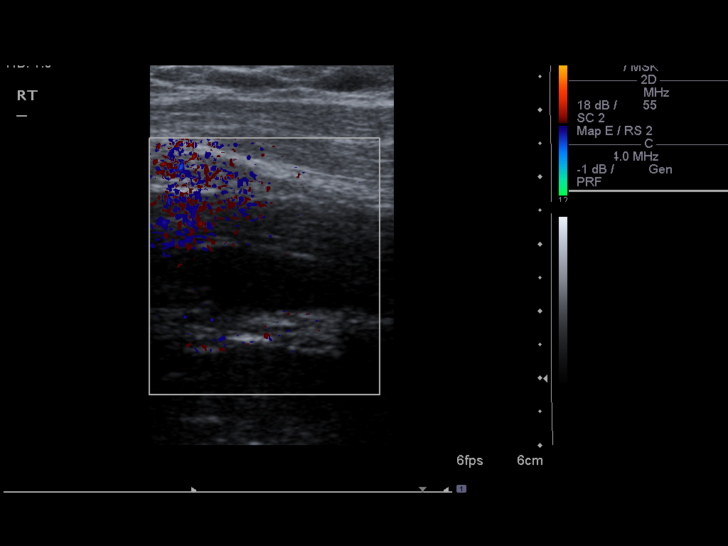
[im 4/14]
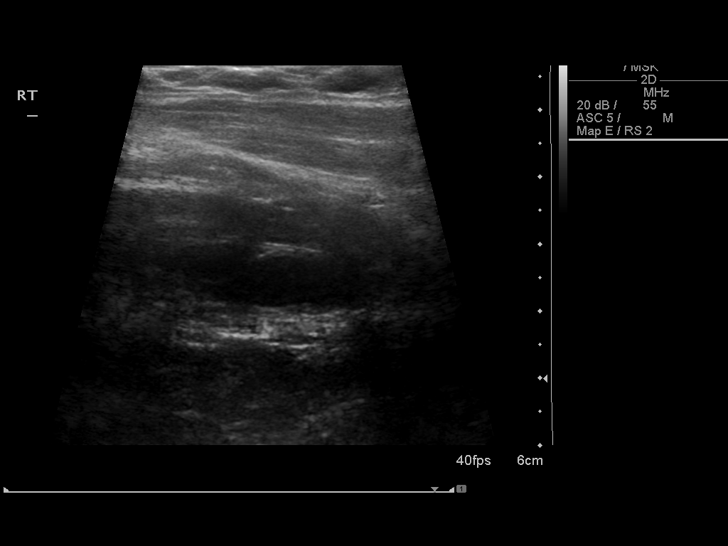
[im 5/14]
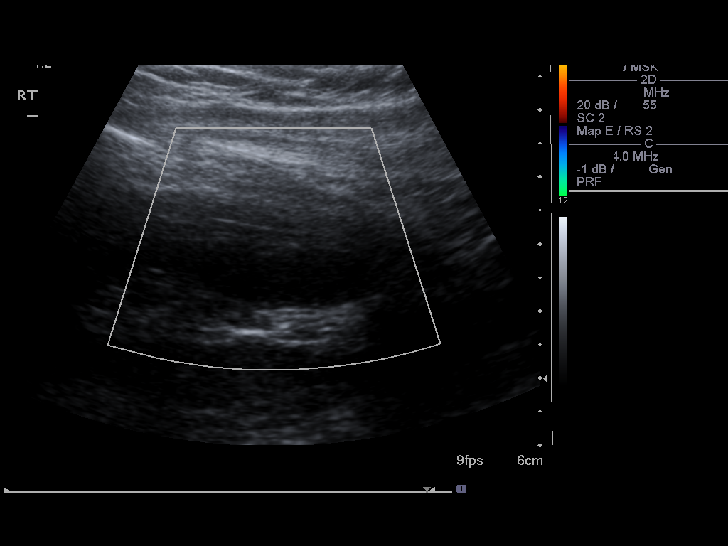
[im 6/14]
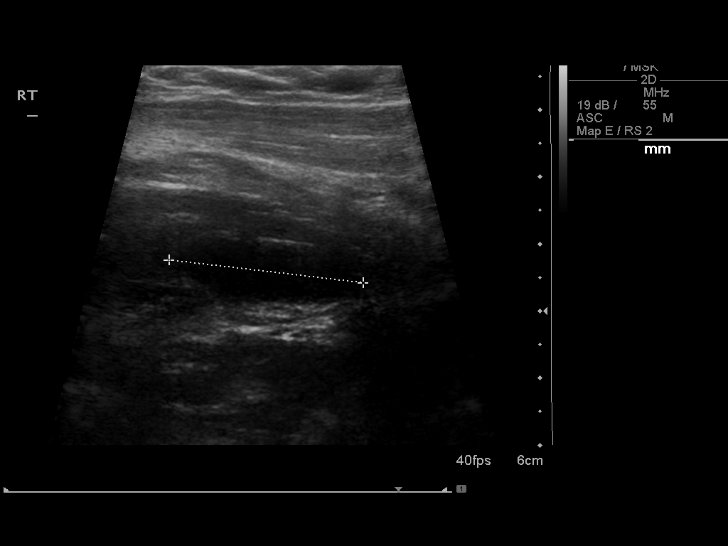
[im 8/14]
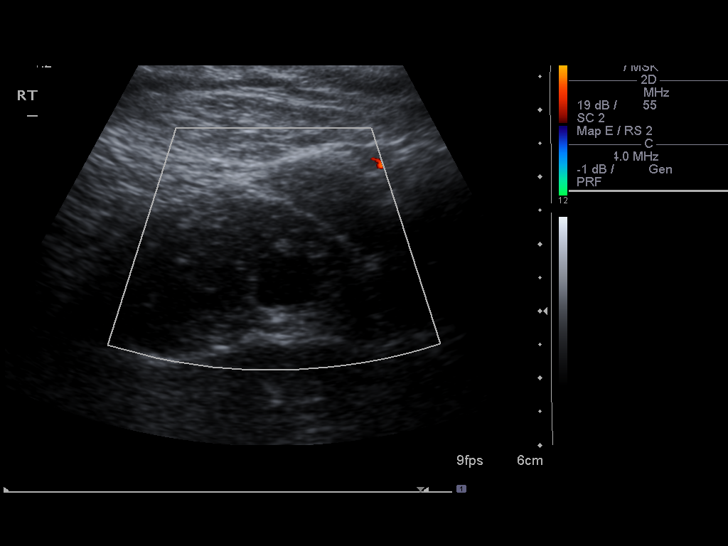
[im 9/14]
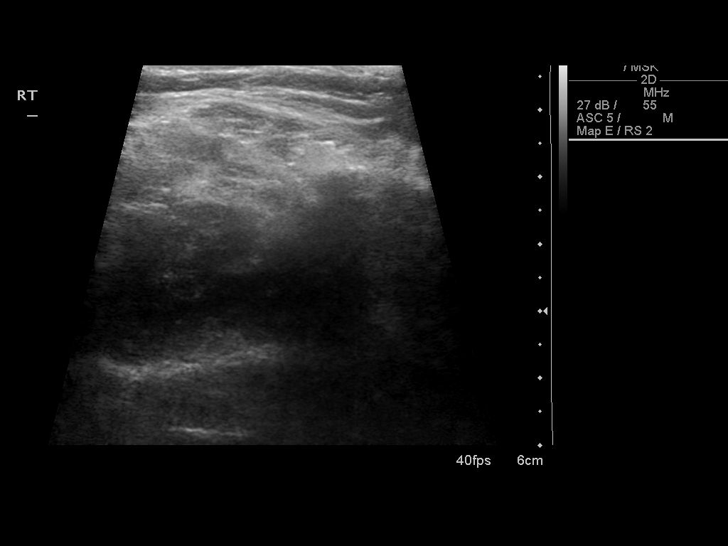
[im 10/14]
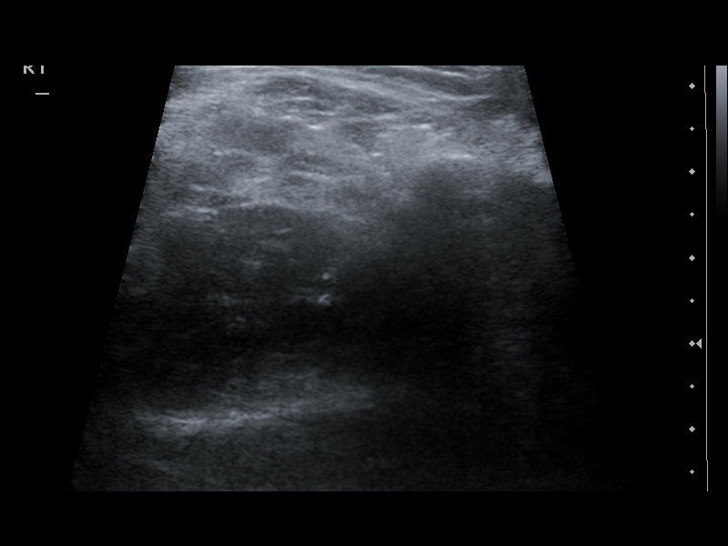
[im 11/14]
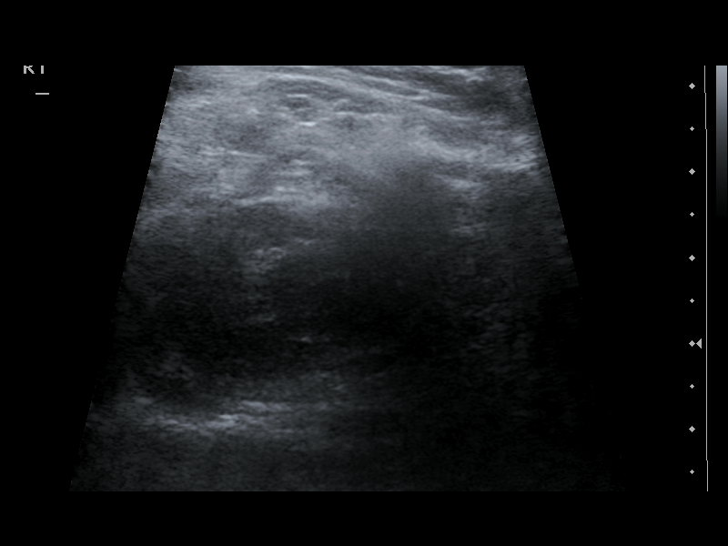
[im 12/14]
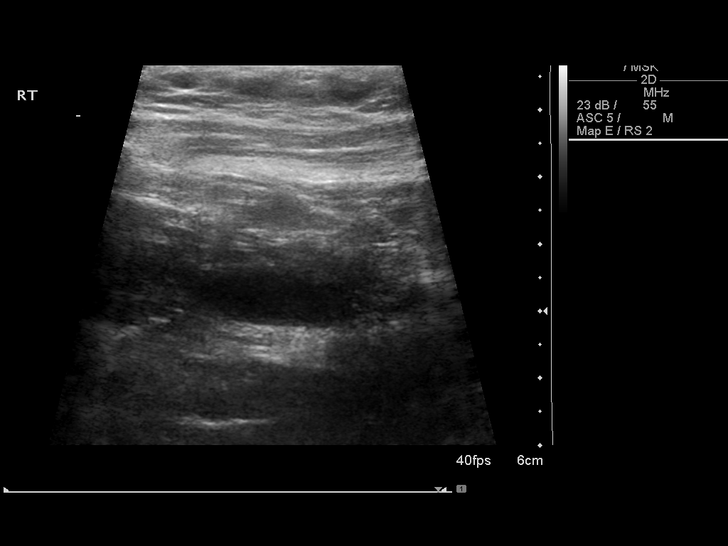
[im 13/14]
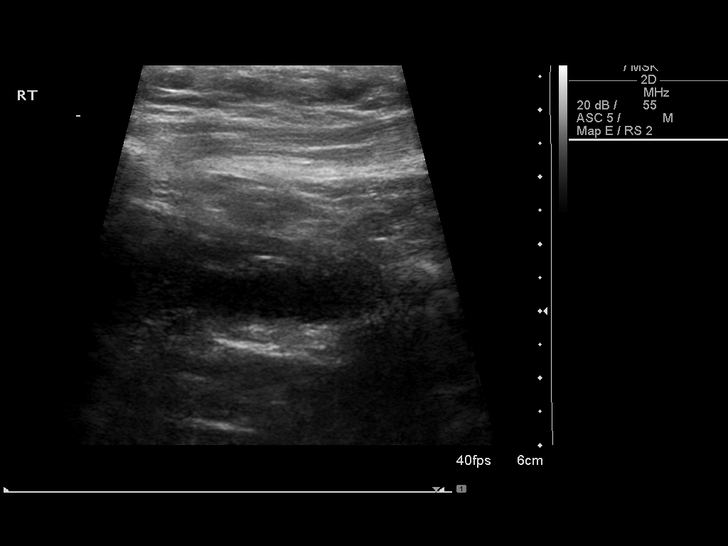
[im 14/14]
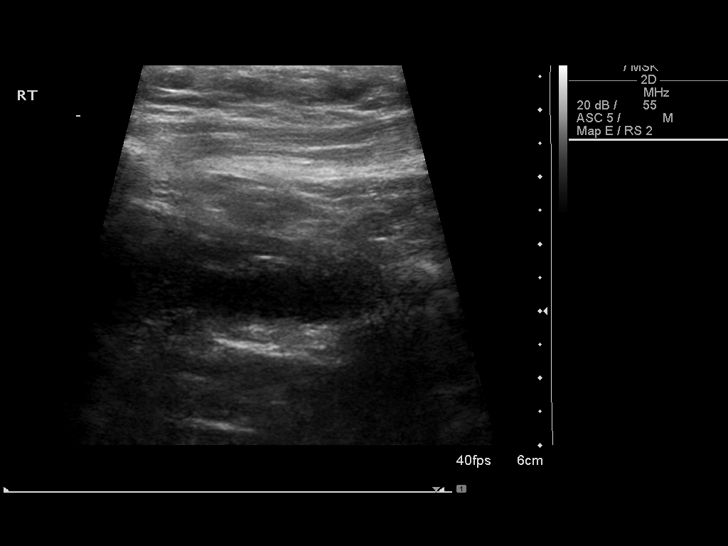

[13 of 14 positions shown; findings below may reference images not displayed]

Technique and findings: The procedure, risks (including but not
limited to bleeding, infection, organ damage), benefits, and
alternatives were explained to the patient.  Questions regarding
the procedure were encouraged and answered.  The patient
understands and consents to the procedure. Survey ultrasound of the
right supraspinatus musculature was performed.  The larger
elongated collection seen on MRI was localized, measuring
approximately 8 x 29 mm.  An appropriate skin entry site was
determined. Operator donned sterile gloves and mask.    Site was
marked, prepped with Betadine, draped in usual sterile fashion,
infiltrated locally with 1% lidocaine.
Under real time ultrasound guidance, an 18 gauge spinal needle was
advanced into the collection.  Only scant amount of clear yellow
viscous fluid could be aspirated despite needle repositioning
throughout various components of the collection.
Because the collection was not significantly decompressed,
injection of the lesion with steroid and anesthetic was deferred.
The patient tolerated the procedure well. No immediate
complication.
IMPRESSION: Technically successful ultrasound-guided 18 gauge aspiration of
dominant right supraspinatus cystic process, returning only a scant
amount of viscous clear yellow fluid.

## 2016-08-26 ENCOUNTER — Other Ambulatory Visit: Payer: Self-pay | Admitting: General Surgery

## 2016-08-26 NOTE — H&P (Signed)
History of Present Illness Romie Levee MD; 08/26/2016 12:04 PM) Patient words: perianal abscess vs fistula.  The patient is a 56 year old female who presents with a complaint of anal problems. Patient states approximately one year ago she developed a large anal mass. This ruptured and drained on its own. Since that time she continues to have drainage on a regular basis. She denies any pain or bleeding. She has regular bowel movements but does endorse some occasional diarrhea. She has never had any anorectal procedures in the past.   Past Surgical History Doristine Devoid, CMA; 08/26/2016 11:36 AM) Cesarean Section - Multiple Dialysis Shunt / Fistula Tonsillectomy  Diagnostic Studies History Doristine Devoid, CMA; 08/26/2016 11:36 AM) Colonoscopy never Mammogram within last year Pap Smear 1-5 years ago  Allergies Doristine Devoid, CMA; 08/26/2016 11:36 AM) No Known Allergies 08/14/2016  Medication History Doristine Devoid, CMA; 08/26/2016 11:38 AM) Rena-Vite (Oral) Active. Gabapentin (  Tablet, Oral) Active. LamoTRIgine (  Tablet, Oral) Active. Multivitamin (Oral) Active. Sensipar (  Tablet, Oral) Active. Medications Reconciled  Social History Doristine Devoid, CMA; 08/26/2016 11:36 AM) Alcohol use Occasional alcohol use. Caffeine use Carbonated beverages, Coffee, Tea. No drug use Tobacco use Never smoker.  Family History Doristine Devoid, [place]; 08/26/2016 11:36 AM) First Degree Relatives No pertinent family history  Pregnancy / Birth History Doristine Devoid, CMA; 08/26/2016 11:36 AM) Age at menarche 11 years. Gravida 3 Length (months) of breastfeeding 3-6 Maternal age 18-20 Para 2 Regular periods  Other Problems Doristine Devoid, CMA; 08/26/2016 11:36 AM) Back Pain Chronic Renal Failure Syndrome Gastroesophageal Reflux Disease Hemorrhoids High blood pressure Seizure Disorder Sleep Apnea     Review of Systems (Chemira Jones CMA;  08/26/2016 11:36 AM) General Present- Appetite Loss, Chills, Fatigue, Fever and Weight Loss. Not Present- Night Sweats and Weight Gain. HEENT Present- Wears glasses/contact lenses. Not Present- Earache, Hearing Loss, Hoarseness, Nose Bleed, Oral Ulcers, Ringing in the Ears, Seasonal Allergies, Sinus Pain, Sore Throat, Visual Disturbances and Yellow Eyes. Respiratory Present- Wheezing. Not Present- Bloody sputum, Chronic Cough, Difficulty Breathing and Snoring. Breast Not Present- Breast Mass, Breast Pain, Nipple Discharge and Skin Changes. Cardiovascular Not Present- Chest Pain, Difficulty Breathing Lying Down, Leg Cramps, Palpitations, Rapid Heart Rate, Shortness of Breath and Swelling of Extremities. Gastrointestinal Present- Abdominal Pain, Chronic diarrhea and Nausea. Not Present- Bloating, Bloody Stool, Change in Bowel Habits, Constipation, Difficulty Swallowing, Excessive gas, Gets full quickly at meals, Hemorrhoids, Indigestion, Rectal Pain and Vomiting. Female Genitourinary Not Present- Frequency, Nocturia, Painful Urination, Pelvic Pain and Urgency. Musculoskeletal Present- Back Pain. Not Present- Joint Pain, Joint Stiffness, Muscle Pain, Muscle Weakness and Swelling of Extremities. Neurological Present- Seizures. Not Present- Decreased Memory, Fainting, Headaches, Numbness, Tingling, Tremor, Trouble walking and Weakness. Psychiatric Not Present- Anxiety, Bipolar, Change in Sleep Pattern, Depression, Fearful and Frequent crying. Endocrine Not Present- Cold Intolerance, Excessive Hunger, Hair Changes, Heat Intolerance, Hot flashes and New Diabetes. Hematology Not Present- Blood Thinners, Easy Bruising, Excessive bleeding, Gland problems, HIV and Persistent Infections.  Vitals (Chemira Jones CMA; 08/26/2016 11:36 AM) 08/26/2016 11:36 AM Weight: 291.4 lb Height: 61in Body Surface Area: 2.22 m Body Mass Index: 55.06 kg/m  Temp.: 97.45F(Oral)  Pulse: 100 (Regular)  BP: 126/84  (Sitting, Left Arm, Standard)      Physical Exam Romie Levee MD; 08/26/2016 12:05 PM)  General Mental Status-Alert. General Appearance-Not in acute distress. Build & Nutrition-Well nourished. Posture-Normal posture. Gait-Normal.  Head and Neck Head-normocephalic, atraumatic with no lesions or palpable masses. Trachea-midline.  Chest and Lung Exam Chest and lung exam reveals -  on auscultation, normal breath sounds, no adventitious sounds and normal vocal resonance.  Cardiovascular Cardiovascular examination reveals -normal heart sounds, regular rate and rhythm with no murmurs and no digital clubbing, cyanosis, edema, increased warmth or tenderness.  Abdomen Inspection Inspection of the abdomen reveals - No Hernias. Palpation/Percussion Palpation and Percussion of the abdomen reveal - Soft, Non Tender, No Rigidity (guarding), No hepatosplenomegaly and No Palpable abdominal masses.  Rectal Anorectal Exam External - Note: There is a left lateral perianal lesion with a palpable cord. Fistula probe inserted easily passes towards the anal canal.  Neurologic Neurologic evaluation reveals -alert and oriented x 3 with no impairment of recent or remote memory, normal attention span and ability to concentrate, normal sensation and normal coordination.  Musculoskeletal Normal Exam - Bilateral-Upper Extremity Strength Normal and Lower Extremity Strength Normal.    Assessment & Plan Romie Levee MD; 08/26/2016 12:03 PM)  ANAL FISTULA (K60.3) Impression: 56 year old female who presents to the office for evaluation of a chronically draining anal wound. Upon evaluation it appears that she has a fistula of the left lateral anal canal. I have recommended exam under anesthesia with possible fistulotomy versus seton placement. We have discussed this in detail including a small risk of incontinence with fistulotomy. We also discussed the need for additional surgery if a  seton was placed. We discussed difficulty with wound healing due to her obesity and renal disease.

## 2018-12-30 ENCOUNTER — Telehealth: Payer: Self-pay | Admitting: *Deleted

## 2018-12-30 NOTE — Telephone Encounter (Signed)
ERROR

## 2019-06-01 ENCOUNTER — Other Ambulatory Visit: Payer: Self-pay

## 2019-06-01 ENCOUNTER — Encounter: Payer: Self-pay | Admitting: Neurology

## 2019-06-01 ENCOUNTER — Ambulatory Visit: Payer: Medicare HMO | Admitting: Neurology

## 2019-06-01 ENCOUNTER — Ambulatory Visit (INDEPENDENT_AMBULATORY_CARE_PROVIDER_SITE_OTHER): Payer: Medicare HMO | Admitting: Neurology

## 2019-06-01 DIAGNOSIS — M47812 Spondylosis without myelopathy or radiculopathy, cervical region: Secondary | ICD-10-CM

## 2019-06-01 DIAGNOSIS — M79601 Pain in right arm: Secondary | ICD-10-CM | POA: Diagnosis not present

## 2019-06-01 NOTE — Progress Notes (Signed)
Please refer to EMG and nerve conduction procedure note.  

## 2019-06-01 NOTE — Procedures (Signed)
     HISTORY:  Rebecca Mora is a 59 year old patient with a history of onset of right neck and shoulder discomfort and pain down the right arm with numbness in the hand that began in August 2020.  The patient has had ongoing significant pain and is being evaluated for possible neuropathy or cervical radiculopathy.  She has had some discomfort into the back on the right and numbness into the right thigh when she lies down at night.  NERVE CONDUCTION STUDIES:  Nerve conduction studies were performed on the upper extremities bilaterally.  The distal motor latencies and motor amplitudes for the median and ulnar nerves bilaterally were within normal limits.  The nerve conduction velocities for these nerves were normal bilaterally.  The sensory latencies for the median nerves were slightly prolonged on the right, normal on the left, and normal for the ulnar nerves bilaterally.  The F-wave latencies for the ulnar nerves were normal bilaterally.  Nerve conduction studies were performed on the right lower extremity.  The distal motor latencies and motor amplitudes for the right peroneal and posterior tibial nerves were normal with normal nerve conduction velocities seen for these nerves.  The right peroneal and sural sensory latencies were within normal limits.  The F-wave latency for the right posterior tibial nerve was normal.  EMG STUDIES:  EMG study was performed on the right upper extremity:  The first dorsal interosseous muscle reveals 2 to 4 K units with full recruitment. No fibrillations or positive waves were noted. The abductor pollicis brevis muscle reveals 2 to 4 K units with full recruitment. No fibrillations or positive waves were noted. The extensor indicis proprius muscle reveals 1 to 3 K units with full recruitment. No fibrillations or positive waves were noted. The pronator teres muscle reveals 2 to 3 K units with full recruitment. No fibrillations or positive waves were noted. The  biceps muscle reveals 1 to 2 K units with full recruitment. No fibrillations or positive waves were noted. The triceps muscle reveals 2 to 4 K units with full recruitment. No fibrillations or positive waves were noted. The anterior deltoid muscle reveals 2 to 3 K units with full recruitment. No fibrillations or positive waves were noted.  The patient refused further EMG evaluation.   IMPRESSION:  Nerve conduction studies done on both upper extremities and on the right lower extremity were relatively unremarkable with exception of isolated prolongation of the sensory latency for the right median nerve.  This could be consistent with a borderline carpal tunnel syndrome.  There is no evidence of a generalized peripheral neuropathy.  EMG evaluation of the right upper extremity was unremarkable, no evidence of an overlying cervical radiculopathy was seen.  Marlan Palau MD 06/01/2019 4:32 PM  [facility] [address] [place], [place] [address]  Phone [phone] Fax [phone]

## 2019-06-02 NOTE — Progress Notes (Signed)
[facility]    Nerve / Sites Muscle Latency Ref. Amplitude Ref. Rel Amp Segments Distance Velocity Ref. Area    ms ms mV mV %  cm m/s m/s mVms  L Median - APB     Wrist APB 3.7 ?4.4 10.1 ?4.0 100 Wrist - APB 7   30.2     Upper arm APB 7.4  8.8  87.3 Upper arm - Wrist 20 54 ?49 26.1  R Median - APB     Wrist APB 3.9 ?4.4 10.1 ?4.0 100 Wrist - APB 7   35.0     Upper arm APB 7.8  8.7  86 Upper arm - Wrist 21 54 ?49 30.6  L Ulnar - ADM     Wrist ADM 2.7 ?3.3 10.0 ?6.0 100 Wrist - ADM 7   26.2     B.Elbow ADM 6.1  8.8  88.4 B.Elbow - Wrist 18 53 ?49 24.9     A.Elbow ADM 8.2  8.5  96.6 A.Elbow - B.Elbow 10 49 ?49 26.4         A.Elbow - Wrist      R Ulnar - ADM     Wrist ADM 2.8 ?3.3 12.4 ?6.0 100 Wrist - ADM 7   32.8     B.Elbow ADM 6.2  10.8  87.1 B.Elbow - Wrist 18 52 ?49 31.7     A.Elbow ADM 8.3  10.3  95.3 A.Elbow - B.Elbow 10 49 ?49 31.0         A.Elbow - Wrist      R Peroneal - EDB     Ankle EDB 3.8 ?6.5 10.2 ?2.0 100 Ankle - EDB 9   25.3     Fib head EDB 9.4  9.0  88.1 Fib head - Ankle 25 45 ?44 23.3     Pop fossa EDB 11.6  8.5  94.6 Pop fossa - Fib head 10 44 ?44 22.5         Pop fossa - Ankle      R Tibial - AH     Ankle AH 5.1 ?5.8 10.1 ?4.0 100 Ankle - AH 9   24.6     Pop fossa AH 11.8  7.5  74 Pop fossa - Ankle 36 54 ?41 21.8                    [facility]    Nerve / Sites Rec. Site Peak Lat Ref.  Amp Ref. Segments Distance Peak Diff Ref.    ms ms V V  cm ms ms  R Sural - Ankle (Calf)     Calf Ankle 3.6 ?4.4 20 ?6 Calf - Ankle 14    R Superficial peroneal - Ankle     Lat leg Ankle 3.8 ?4.4 7 ?6 Lat leg - Ankle 14    L Median, Ulnar - Transcarpal comparison     Median Palm Wrist 2.3 ?2.2 101 ?35 Median Palm - Wrist 8       Ulnar Palm Wrist 2.1 ?2.2 15 ?12 Ulnar Palm - Wrist 8          Median Palm - Ulnar Palm  0.1 ?0.4  R Median, Ulnar - Transcarpal comparison     Median Palm Wrist 2.8 ?2.2 72 ?35 Median Palm - Wrist 8       Ulnar Palm Wrist 2.0 ?2.2 19 ?12 Ulnar Palm - Wrist 8           Median Palm - [employer]  0.7 ?0.4  L Median - Orthodromic (Dig II, Mid palm)     Dig II Wrist 3.3 ?3.4 13 ?10 Dig II - Wrist 13    R Median - Orthodromic (Dig II, Mid palm)     Dig II Wrist 3.9 ?3.4 10 ?10 Dig II - Wrist 13    L Ulnar - Orthodromic, (Dig V, Mid palm)     Dig V Wrist 3.0 ?3.1 5 ?5 Dig V - Wrist 11    R Ulnar - Orthodromic, (Dig V, Mid palm)     Dig V Wrist 3.0 ?3.1 7 ?5 Dig V - Wrist 37                       F  Wave    Nerve F Lat Ref.   ms ms  L Ulnar - ADM 27.6 ?32.0  R Ulnar - ADM 26.6 ?32.0  R Tibial - AH 51.0 ?56.0

## 2021-07-23 ENCOUNTER — Other Ambulatory Visit: Payer: Self-pay

## 2024-02-24 ENCOUNTER — Other Ambulatory Visit: Payer: Self-pay | Admitting: Physician Assistant

## 2024-02-24 DIAGNOSIS — M5416 Radiculopathy, lumbar region: Secondary | ICD-10-CM

## 2024-02-24 DIAGNOSIS — M5412 Radiculopathy, cervical region: Secondary | ICD-10-CM

## 2024-03-08 ENCOUNTER — Encounter: Payer: Self-pay | Admitting: Physician Assistant

## 2024-03-10 ENCOUNTER — Encounter: Payer: Self-pay | Admitting: Physician Assistant

## 2024-03-11 ENCOUNTER — Encounter: Payer: Self-pay | Admitting: Physician Assistant

## 2024-03-13 ENCOUNTER — Other Ambulatory Visit: Payer: Self-pay
